# Patient Record
Sex: Male | Born: 1970 | Race: White | Hispanic: No | Marital: Married | Smoking: Never smoker
Health system: Southern US, Community
[De-identification: ages and names within clinical notes are randomized; demographics above are authoritative.]

## PROBLEM LIST (undated history)

## (undated) DIAGNOSIS — J45909 Unspecified asthma, uncomplicated: Secondary | ICD-10-CM

## (undated) HISTORY — DX: Unspecified asthma, uncomplicated: J45.909

## (undated) HISTORY — PX: ANKLE FRACTURE SURGERY: SHX122

---

## 2004-02-18 ENCOUNTER — Emergency Department (HOSPITAL_COMMUNITY): Admission: EM | Admit: 2004-02-18 | Discharge: 2004-02-19 | Payer: Self-pay | Admitting: Family Medicine

## 2004-06-27 ENCOUNTER — Ambulatory Visit (HOSPITAL_COMMUNITY): Admission: RE | Admit: 2004-06-27 | Discharge: 2004-06-27 | Payer: Self-pay | Admitting: Family Medicine

## 2008-08-31 ENCOUNTER — Ambulatory Visit (HOSPITAL_COMMUNITY): Admission: RE | Admit: 2008-08-31 | Discharge: 2008-08-31 | Payer: Self-pay | Admitting: Emergency Medicine

## 2010-08-29 ENCOUNTER — Encounter: Admission: RE | Admit: 2010-08-29 | Discharge: 2010-08-29 | Payer: Self-pay | Admitting: Family Medicine

## 2019-06-17 ENCOUNTER — Telehealth: Payer: Self-pay | Admitting: Family

## 2019-06-17 ENCOUNTER — Ambulatory Visit: Payer: Self-pay

## 2019-06-17 DIAGNOSIS — R0600 Dyspnea, unspecified: Secondary | ICD-10-CM

## 2019-06-17 NOTE — Telephone Encounter (Signed)
Pt called after using Care bot through My Chart. He states that he has some COVID-19 symptoms. SOB, chest feels tight, mild cough and headaches. His symptoms have been ongoing for about 1 week. He has no fever. He is a patient at [facility]. Per protocol patient will contact his PCP for evaluation of symptoms. Care advice read to patient. Patient verbalized understanding of all instructions.  Reason for Disposition . MILD difficulty breathing (e.g., minimal/no SOB at rest, SOB with walking, pulse <100)  Answer Assessment - Initial Assessment Questions 1. COVID-19 DIAGNOSIS: "Who made your Coronavirus (COVID-19) diagnosis?" "Was it confirmed by a positive lab test?" If not diagnosed by a HCP, ask "Are there lots of cases (community spread) where you live?" (See public health department website, if unsure)    [place] 2. ONSET: "When did the COVID-19 symptoms start?"     1 week 3. WORST SYMPTOM: "What is your worst symptom?" (e.g., cough, fever, shortness of breath, muscle aches)    SOB 4. COUGH: "Do you have a cough?" If so, ask: "How bad is the cough?"       mild 5. FEVER: "Do you have a fever?" If so, ask: "What is your temperature, how was it measured, and when did it start?"    no 6. RESPIRATORY STATUS: "Describe your breathing?" (e.g., shortness of breath, wheezing, unable to speak)     SOB but tight 7. BETTER-SAME-WORSE: "Are you getting better, staying the same or getting worse compared to yesterday?"  If getting worse, ask, "In what way?"     same 8. HIGH RISK DISEASE: "Do you have any chronic medical problems?" (e.g., asthma, heart or lung disease, weak immune system, etc.)     Asthma as child 9. PREGNANCY: "Is there any chance you are pregnant?" "When was your last menstrual period?"     N/A 10. OTHER SYMPTOMS: "Do you have any other symptoms?"  (e.g., chills, fatigue, headache, loss of smell or taste, muscle pain, sore throat)      Headache, tired,  Protocols used:  CORONAVIRUS (COVID-19) DIAGNOSED OR SUSPECTED-A-AH

## 2019-06-17 NOTE — Progress Notes (Signed)
E-Visit for Co.e5 rona Virus Screening   Your current symptoms could be consistent with the coronavirus.  Call your health care provider or local health department to request and arrange formal testing. Many health care providers can now test patients at their office but not all are.  Please quarantine yourself while awaiting your test results.  [place] [phone], [place], [place] or visit [email or web address]     COVID-19 is a respiratory illness with symptoms that are similar to the flu. Symptoms are typically mild to moderate, but there have been cases of severe illness and death due to the virus. The following symptoms may appear 2-14 days after exposure: . Fever . Cough . Shortness of breath or difficulty breathing . Chills . Repeated shaking with chills . Muscle pain . Headache . Sore throat . New loss of taste or smell . Fatigue . Congestion or runny nose . Nausea or vomiting . Diarrhea  It is vitally important that if you feel that you have an infection such as this virus or any other virus that you stay home and away from places where you may spread it to others.  You should self-quarantine for 14 days if you have symptoms that could potentially be coronavirus or have been in close contact a with a person diagnosed with COVID-19 within the last 2 weeks. You should avoid contact with people age 22 and older.   You should wear a mask or cloth face covering over your nose and mouth if you must be around other people or animals, including pets (even at home). Try to stay at least 6 feet away from other people. This will protect the people around you.  You may also take acetaminophen (Tylenol) as needed for fever.   Reduce your risk of any infection by using the same precautions used for avoiding the common cold or flu:   Marland Kitchen Wash your hands often with soap and warm water for at least 20 seconds.  If soap and water are not readily available, use an alcohol-based hand sanitizer with at least 60% alcohol.  . If coughing or sneezing, cover your mouth and nose by coughing or sneezing into the elbow areas of your shirt or coat, into a tissue or into your sleeve (not your hands). . Avoid shaking hands with others and consider head nods or verbal greetings only. . Avoid touching your eyes, nose, or mouth with unwashed hands.  . Avoid close contact with people who are sick. . Avoid places or events with large numbers of people in one location, like concerts or sporting events. . Carefully consider travel plans you have or are making. . If you are planning any travel outside or inside the [place], visit the CDC's Travelers' Health webpage for the latest health notices. . If you have some symptoms but not all symptoms, continue to monitor at home and seek medical attention if your symptoms worsen. . If you are having a medical emergency, call 911.  HOME CARE . Only take medications as instructed by your medical team. . Drink plenty of fluids and get plenty of rest. . A steam or ultrasonic humidifier can help if you have congestion.   GET HELP RIGHT AWAY IF YOU HAVE EMERGENCY WARNING SIGNS** FOR COVID-19. If you or someone is showing any of these signs seek emergency medical care immediately. Call 911 or proceed to your closest emergency facility if: . You develop worsening high fever. . Trouble  breathing . Bluish lips or face . Persistent pain or pressure in the chest . New confusion . Inability to wake or stay awake . You cough up blood. . Your symptoms become more severe  **This list is not all possible symptoms. Contact your medical provider for any symptoms that are sever or concerning to you.   MAKE SURE YOU   Understand these instructions.  Will watch your condition.  Will get help right away if you are not  doing well or get worse.  Your e-visit answers were reviewed by a board certified advanced clinical practitioner to complete your personal care plan.  Depending on the condition, your plan could have included both over the counter or prescription medications.  If there is a problem please reply once you have received a response from your provider.  Your safety is important to us.  If you have drug allergies check your prescription carefully.    You can use MyChart to ask questions about today's visit, request a non-urgent call back, or ask for a work or school excuse for 24 hours related to this e-Visit. If it has been greater than 24 hours you will need to follow up with your provider, or enter a new e-Visit to address those concerns. You will get an e-mail in the next two days asking about your experience.  I hope that your e-visit has been valuable and will speed your recovery. Thank you for using e-visits.  Greater than 5 minutes, yet less than 10 minutes of time have been spent researching, coordinating, and implementing care for this patient today.  Thank you for the details you included in the comment boxes. Those details are very helpful in determining the best course of treatment for you and help us to provide the best care.

## 2022-02-28 ENCOUNTER — Emergency Department (HOSPITAL_COMMUNITY)
Admission: EM | Admit: 2022-02-28 | Discharge: 2022-02-28 | Disposition: A | Discharge status: Home / Self Care | Payer: No Typology Code available for payment source | Attending: Emergency Medicine | Admitting: Emergency Medicine

## 2022-02-28 ENCOUNTER — Emergency Department (HOSPITAL_COMMUNITY): Payer: No Typology Code available for payment source

## 2022-02-28 ENCOUNTER — Other Ambulatory Visit: Payer: Self-pay

## 2022-02-28 DIAGNOSIS — W268XXA Contact with other sharp object(s), not elsewhere classified, initial encounter: Secondary | ICD-10-CM | POA: Insufficient documentation

## 2022-02-28 DIAGNOSIS — S61214A Laceration without foreign body of right ring finger without damage to nail, initial encounter: Secondary | ICD-10-CM | POA: Diagnosis not present

## 2022-02-28 DIAGNOSIS — S61212A Laceration without foreign body of right middle finger without damage to nail, initial encounter: Secondary | ICD-10-CM | POA: Insufficient documentation

## 2022-02-28 DIAGNOSIS — S6991XA Unspecified injury of right wrist, hand and finger(s), initial encounter: Secondary | ICD-10-CM | POA: Diagnosis present

## 2022-02-28 MED ORDER — LIDOCAINE HCL (PF) 1 % IJ SOLN
20.0000 mL | Freq: Once | INTRAMUSCULAR | Status: AC
Start: 2022-02-28 — End: 2022-02-28
  Administered 2022-02-28: 20 mL
  Filled 2022-02-28: qty 20

## 2022-02-28 MED ORDER — CEPHALEXIN 500 MG PO CAPS: 500.0000 mg | ORAL_CAPSULE | Freq: Three times a day (TID) | ORAL | 0 refills | Status: AC

## 2022-02-28 MED ORDER — HYDROMORPHONE HCL 1 MG/ML IJ SOLN
1.0000 mg | Freq: Once | INTRAMUSCULAR | Status: AC
  Administered 2022-02-28: 1 mg via INTRAMUSCULAR
  Filled 2022-02-28: qty 1

## 2022-02-28 MED ORDER — OXYCODONE-ACETAMINOPHEN 5-325 MG PO TABS: 1.0000 | ORAL_TABLET | Freq: Four times a day (QID) | ORAL | 0 refills | Status: AC | PRN

## 2022-02-28 NOTE — Discharge Instructions (Signed)
Please follow-up with Dr. Janee Morn, ideally you should be seen early next week for close visit.  Keep wound clean and dry.  Change dressing as discussed.  Take antibiotic as prescribed to help prevent infection.  For pain recommend Tylenol and Motrin as needed.  For breakthrough pain can take the prescribed Percocet.  Do not take Percocet while driving or operating heavy machinery. ? ?If you develop increasing redness, purulence, fever, come back to ER for reassessment. ?

## 2022-02-28 NOTE — ED Triage Notes (Signed)
Pt reports cutting right hand on right table saw, has lacerations to right index, middle and ring finger. Moderate bleeding noted but controlled, bandage applied at triage. Tetanus unknown. Pt is pale, reports feeling lightheaded.  ?

## 2022-02-28 NOTE — ED Provider Triage Note (Signed)
Emergency Medicine Provider Triage Evaluation Note ? ?Joaquim Nam , a 51 y.o. male  was evaluated in triage.  Pt complains of finger lacerations to the right hand after using a table saw about 30 minutes ago.  Right index, right middle, and right ring finger involvement.  Patient had fingers wrapped up in gauze upon arrival.  Hemorrhaging under control.  Denies amputation. ? ?Review of Systems  ?Positive: As above ?Negative: As above ? ?Physical Exam  ?BP 108/89 (BP Location: Left Arm)   Pulse 70   Temp 97.7 ?F (36.5 ?C) (Oral)   Resp 14   SpO2 100%  ?Gen:   Awake, no distress   ?Resp:  Normal effort  ?MSK:   Moves extremities without difficulty  ?Other:  Right index, middle, and ring finger have 2-3 cm lacerations.  Appear moderate to deep.  Blood obstructing deep view.  Demonstrates grossly full ROM.  Slight oozing from laceration sites.  No evidence of necrosis or ischemia to distal digits. ? ?Medical Decision Making  ?Medically screening exam initiated at 3:37 PM.  Appropriate orders placed.  Jhordan Mckibben was informed that the remainder of the evaluation will be completed by another provider, this initial triage assessment does not replace that evaluation, and the importance of remaining in the ED until their evaluation is complete. ? ?Imaging ordered ?  ?Cecil Cobbs, PA-C ?02/28/22 1542 ? ?

## 2022-02-28 NOTE — ED Provider Notes (Signed)
[facility] EMERGENCY DEPARTMENT Provider Note   CSN: [id number] Arrival date & time: 02/28/22  1518     History  Chief Complaint  Patient presents with   Hand Injury    Michael Irwin is a 51 y.o. male.  Presents for hand injury.  Table saw injury, injury happened about 30 minutes prior to arrival.  Multiple fingers involved.  He is left-hand-dominant, injury occurred to his right hand.  Still able to move all of his fingers and still has sensation in all his fingers.  He denies any major medical problems.  He works as a Pharmacist, hospital, Automotive engineer at [employer].  HPI     Home Medications Prior to Admission medications   Medication Sig Start Date End Date Taking? Authorizing Provider  cephALEXin (KEFLEX) 500 MG capsule Take 1 capsule (500 mg total) by mouth 3 (three) times daily for 7 days. 02/28/22 03/07/22 Yes Arihana Ambrocio, Ellwood Dense, MD  oxyCODONE-acetaminophen (PERCOCET/ROXICET) 5-325 MG tablet Take 1 tablet by mouth every 6 (six) hours as needed for up to 3 days for severe pain. 02/28/22 03/03/22 Yes Lucrezia Starch, MD      Allergies    Patient has no known allergies.    Review of Systems   Review of Systems  Musculoskeletal:  Positive for arthralgias.  Skin:  Positive for wound.  All other systems reviewed and are negative.  Physical Exam Updated Vital Signs BP 97/72 (BP Location: Right Arm)    Pulse 61    Temp 98.7 F (37.1 C) (Oral)    Resp 15    SpO2 98%  Physical Exam Vitals and nursing note reviewed.  Constitutional:      General: He is not in acute distress.    Appearance: He is well-developed.  HENT:     Head: Normocephalic and atraumatic.  Eyes:     Conjunctiva/sclera: Conjunctivae normal.  Cardiovascular:     Rate and Rhythm: Normal rate.     Pulses: Normal pulses.  Pulmonary:     Effort: Pulmonary effort is normal. No respiratory distress.  Musculoskeletal:     Cervical back: Neck supple.     Comments: Right hand: There is jagged 1 cm  laceration to the distal volar tip of the ring finger, 2 cm laceration to the distal volar tip of the middle finger, abrasion to the ring finger, flexion/extension in his fingers appears to be grossly intact, no nail involvement, distal cap refill intact in all fingertips  Sensation to light touch is decreased in the radial aspect of the distal ring finger tip and the radial aspect of the distal middle fingertip  Skin:    General: Skin is warm and dry.     Capillary Refill: Capillary refill takes less than 2 seconds.  Neurological:     Mental Status: He is alert.  Psychiatric:        Mood and Affect: Mood normal.        Behavior: Behavior normal.     Media Information Document Information  Photos  Right hand  02/28/2022 21:13  Attached To:  Hospital Encounter on 02/28/22   Source Information  Luccia Reinheimer, Ellwood Dense, MD   [facility]     ED Results / Procedures / Treatments   Labs (all labs ordered are listed, but only abnormal results are displayed) Labs Reviewed - No data to display  EKG None  Radiology DG Hand Complete Left  Result Date: 02/28/2022 CLINICAL DATA:  Table saw laceration. EXAM: LEFT HAND - COMPLETE  3+ VIEW COMPARISON:  None. FINDINGS: Soft tissue irregularity/laceration involving the tips of the index, middle, and ring fingers. No radiopaque foreign body identified. No acute fracture or dislocation. Joint spaces are preserved. Bone mineralization is normal. IMPRESSION: 1. Soft tissue injury of the tips of the index, middle, and ring fingers. No acute osseous abnormality or radiopaque foreign body. Electronically Signed   By: Titus Dubin M.D.   On: 02/28/2022 16:15    Procedures .Marland KitchenLaceration Repair  Date/Time: 03/01/2022 1:09 AM Performed by: Lucrezia Starch, MD Authorized by: Lucrezia Starch, MD   Consent:    Consent obtained:  Verbal   Consent given by:  Patient   Risks, benefits, and alternatives were discussed: yes     Risks discussed:   Infection, need for additional repair, nerve damage, poor wound healing, poor cosmetic result, pain, vascular damage, retained foreign body and tendon damage   Alternatives discussed:  No treatment, delayed treatment, observation and referral Universal protocol:    Immediately prior to procedure, a time out was called: yes   Laceration details:    Location:  Finger   Finger location:  R long finger   Length (cm):  2 Treatment:    Area cleansed with:  Saline   Amount of cleaning:  Extensive   Irrigation solution:  Sterile saline   Irrigation method:  Syringe   Debridement:  None   Undermining:  None Skin repair:    Repair method:  Sutures   Suture size:  4-0   Suture material:  Chromic gut   Suture technique:  Simple interrupted   Number of sutures:  7 Approximation:    Approximation:  Close Repair type:    Repair type:  Intermediate Post-procedure details:    Dressing:  Tube gauze (xeroform gauze)   Procedure completion:  Tolerated well, no immediate complications Comments:     Soaked in sterile water, rinsed with dilute Betadine solution and additional sterile water.  Perform digital block with lidocaine without epinephrine.  Placed finger tourniquet.  Total tourniquet time for finger less than 10 minutes.  Approximated wound using 4-0 Chromic Gut simple interrupted sutures. Marland Kitchen.Laceration Repair  Date/Time: 03/01/2022 1:11 AM Performed by: Lucrezia Starch, MD Authorized by: Lucrezia Starch, MD   Consent:    Consent given by:  Patient   Risks, benefits, and alternatives were discussed: yes     Risks discussed:  Nerve damage, infection, need for additional repair, poor cosmetic result, pain, poor wound healing, vascular damage, tendon damage and retained foreign body   Alternatives discussed:  No treatment, delayed treatment, observation and referral Universal protocol:    Patient identity confirmed:  Verbally with patient Laceration details:    Location:  Finger    Finger location:  R ring finger   Length (cm):  2 Treatment:    Area cleansed with:  Saline and povidone-iodine   Amount of cleaning:  Extensive   Irrigation solution:  Sterile water Skin repair:    Repair method:  Sutures   Suture size:  4-0   Suture material:  Chromic gut   Number of sutures:  4 Approximation:    Approximation:  Close Repair type:    Repair type:  Intermediate Post-procedure details:    Procedure completion:  Tolerated well, no immediate complications Comments:     Soaked in sterile water, rinsed with dilute Betadine solution and additional sterile water.  Perform digital block with lidocaine without epinephrine.  Placed finger tourniquet.  Total tourniquet time for finger  less than 10 minutes.  Approximated wound using 4-0 Chromic Gut simple interrupted sutures.    Medications Ordered in ED Medications  lidocaine (PF) (XYLOCAINE) 1 % injection 20 mL (20 mLs Infiltration Given 02/28/22 2137)  HYDROmorphone (DILAUDID) injection 1 mg (1 mg Intramuscular Given 02/28/22 2135)    ED Course/ Medical Decision Making/ A&P                           Medical Decision Making Risk Prescription drug management.   51 year old gentleman presented to ER with concern for tablesaw injury.  He was found to have laceration to primarily his distal volar right ring finger and right middle finger.  The index finger cut was only very superficial.  X-ray was unremarkable.  Independently reviewed and interpreted results.  He did have decreased sensation over the tips of both ring and middle finger.  Concern for possibility of nerve injury.  Consulted hand surgery and discussed with Dr. Grandville Silos.  He reviewed the case with me and the photos in epic.  Advised closing wounds primarily in the ER today, advised using 4-0 Chromic Gut.  Wounds were thoroughly irrigated with sterile water and then rinsed with dilute Betadine solution followed by additional sterile saline irrigation.  Utilized digital  blocks for both ring and middle finger.  Then performed suture repair.  Finger tourniquet was utilized during repair, less than 10 minutes per finger was used.  While repairing the ring finger and after additional irrigation was concerned about possibility of flexor tendon injury.  I discussed this with Dr. Grandville Silos who states still recommended closure in ER tonight and outpatient follow-up.  He will help make this arrangement.  Patient will be placed on Keflex for antibiotic prophylaxis.  Stressed importance of following up with hand surgery due to concern for possibility of both nerve and tendon injury.  Reviewed return precautions and discharged home with wife.          Final Clinical Impression(s) / ED Diagnoses Final diagnoses:  Laceration of right middle finger without foreign body without damage to nail, initial encounter  Laceration of right ring finger without foreign body without damage to nail, initial encounter    Rx / DC Orders ED Discharge Orders          Ordered    cephALEXin (KEFLEX) 500 MG capsule  3 times daily        02/28/22 2257    oxyCODONE-acetaminophen (PERCOCET/ROXICET) 5-325 MG tablet  Every 6 hours PRN        02/28/22 2257              Lucrezia Starch, MD 03/01/22 0117

## 2022-03-01 NOTE — ED Provider Notes (Signed)
Telephone Encounter ? ?Patient called today to follow-up on his ED visit from yesterday. ?Patient reports that he is feeling well.   ? ?Patient is advised that his tetanus needs updating. ? ?Patient is aware that he can return to the ED for tetanus update.  Patient reports to this provider that he will most likely contact his PCP on Monday and arrange a tetanus update. ? ?Patient otherwise happy with his care as received. ? ? ? ?  ?Wynetta Fines, MD ?03/01/22 1252 ? ?

## 2022-03-26 ENCOUNTER — Encounter: Payer: Self-pay | Admitting: Gastroenterology

## 2022-03-27 ENCOUNTER — Ambulatory Visit (AMBULATORY_SURGERY_CENTER): Payer: BC Managed Care – PPO | Admitting: *Deleted

## 2022-03-27 VITALS — Ht 67.0 in | Wt 170.0 lb

## 2022-03-27 DIAGNOSIS — Z1211 Encounter for screening for malignant neoplasm of colon: Secondary | ICD-10-CM

## 2022-03-27 MED ORDER — CLENPIQ 10-3.5-12 MG-GM -GM/160ML PO SOLN: 1.0000 | ORAL | 0 refills | Status: DC

## 2022-03-27 NOTE — Progress Notes (Signed)

## 2022-04-01 ENCOUNTER — Encounter: Payer: Self-pay | Admitting: Gastroenterology

## 2022-04-11 ENCOUNTER — Encounter: Payer: Self-pay | Admitting: Gastroenterology

## 2022-04-11 ENCOUNTER — Ambulatory Visit (AMBULATORY_SURGERY_CENTER): Payer: BC Managed Care – PPO | Admitting: Gastroenterology

## 2022-04-11 VITALS — BP 106/67 | HR 63 | Temp 98.7°F | Resp 11 | Ht 67.0 in | Wt 170.0 lb

## 2022-04-11 DIAGNOSIS — Z1211 Encounter for screening for malignant neoplasm of colon: Secondary | ICD-10-CM

## 2022-04-11 DIAGNOSIS — K640 First degree hemorrhoids: Secondary | ICD-10-CM

## 2022-04-11 MED ORDER — SODIUM CHLORIDE 0.9 % IV SOLN: 500.0000 mL | Freq: Once | INTRAVENOUS | Status: DC

## 2022-04-11 NOTE — Progress Notes (Signed)
To Pacu, VSS. Report to [email or web address] 

## 2022-04-11 NOTE — Progress Notes (Signed)
Pt's states no medical or surgical changes since previsit or office visit. Vs assessed by C.W 

## 2022-04-11 NOTE — Op Note (Signed)
[facility] ?Patient Name: Michael NamMatthew Brendlinger ?Procedure Date: 04/11/2022 1:26 PM ?MRN: [id number] ?Endoscopist: Doristine LocksVito Rondalyn Belford , MD ?Age: 51 ?Referring MD:  ?Date of Birth: 09/19/1971 ?Gender: Male ?Account #: [id number] ?Procedure:                Colonoscopy ?Indications:              Screening for colorectal malignant neoplasm, This  ?                          is the patient's first colonoscopy ?Medicines:                Monitored Anesthesia Care ?Procedure:                Pre-Anesthesia Assessment: ?                          - Prior to the procedure, a History and Physical  ?                          was performed, and patient medications and  ?                          allergies were reviewed. The patient's tolerance of  ?                          previous anesthesia was also reviewed. The risks  ?                          and benefits of the procedure and the sedation  ?                          options and risks were discussed with the patient.  ?                          All questions were answered, and informed consent  ?                          was obtained. Prior Anticoagulants: The patient has  ?                          taken no previous anticoagulant or antiplatelet  ?                          agents. ASA Grade Assessment: II - A patient with  ?                          mild systemic disease. After reviewing the risks  ?                          and benefits, the patient was deemed in  ?                          satisfactory condition to undergo the procedure. ?  After obtaining informed consent, the colonoscope  ?                          was passed under direct vision. Throughout the  ?                          procedure, the patient's blood pressure, pulse, and  ?                          oxygen saturations were monitored continuously. The  ?                          Olympus CF-HQ190L ([id number]) Colonoscope was  ?                          introduced through the anus  and advanced to the the  ?                          terminal ileum. The colonoscopy was performed  ?                          without difficulty. The patient tolerated the  ?                          procedure well. The quality of the bowel  ?                          preparation was excellent. The terminal ileum,  ?                          ileocecal valve, appendiceal orifice, and rectum  ?                          were photographed. ?Scope In: 1:31:00 PM ?Scope Out: 1:46:28 PM ?Scope Withdrawal Time: 0 hours 10 minutes 45 seconds  ?Total Procedure Duration: 0 hours 15 minutes 28 seconds  ?Findings:                 The perianal and digital rectal examinations were  ?                          normal. ?                          The entire colon appeared normal. ?                          Non-bleeding internal hemorrhoids were found during  ?                          retroflexion. The hemorrhoids were small and Grade  ?                          I (internal hemorrhoids that do not prolapse). ?  The terminal ileum appeared normal. ?Complications:            No immediate complications. ?Estimated Blood Loss:     Estimated blood loss: none. ?Impression:               - The entire colon is normal. ?                          - Non-bleeding internal hemorrhoids. ?                          - The examined portion of the ileum was normal. ?                          - No specimens collected. ?Recommendation:           - Patient has a contact number available for  ?                          emergencies. The signs and symptoms of potential  ?                          delayed complications were discussed with the  ?                          patient. Return to normal activities tomorrow.  ?                          Written discharge instructions were provided to the  ?                          patient. ?                          - Resume previous diet. ?                          - Continue present  medications. ?                          - Repeat colonoscopy in 10 years for screening  ?                          purposes. ?                          - Return to GI office PRN. ?Doristine Locks, MD ?04/11/2022 1:49:50 PM ?

## 2022-04-11 NOTE — Patient Instructions (Signed)
Handout given on hemorrhoids.  YOU HAD AN ENDOSCOPIC PROCEDURE TODAY AT THE [facility]:   Refer to the procedure report that was given to you for any specific questions about what was found during the examination.  If the procedure report does not answer your questions, please call your gastroenterologist to clarify.  If you requested that your care partner not be given the details of your procedure findings, then the procedure report has been included in a sealed envelope for you to review at your convenience later.  YOU SHOULD EXPECT: Some feelings of bloating in the abdomen. Passage of more gas than usual.  Walking can help get rid of the air that was put into your GI tract during the procedure and reduce the bloating. If you had a lower endoscopy (such as a colonoscopy or flexible sigmoidoscopy) you may notice spotting of blood in your stool or on the toilet paper. If you underwent a bowel prep for your procedure, you may not have a normal bowel movement for a few days.  Please Note:  You might notice some irritation and congestion in your nose or some drainage.  This is from the oxygen used during your procedure.  There is no need for concern and it should clear up in a day or so.  SYMPTOMS TO REPORT IMMEDIATELY:  Following lower endoscopy (colonoscopy or flexible sigmoidoscopy):  Excessive amounts of blood in the stool  Significant tenderness or worsening of abdominal pains  Swelling of the abdomen that is new, acute  Fever of 100F or higher   For urgent or emergent issues, a gastroenterologist can be reached at any hour by calling [phone]. Do not use MyChart messaging for urgent concerns.    DIET:  We do recommend a small meal at first, but then you may proceed to your regular diet.  Drink plenty of fluids but you should avoid alcoholic beverages for 24 hours.  ACTIVITY:  You should plan to take it easy for the rest of today and you should NOT DRIVE or use heavy  machinery until tomorrow (because of the sedation medicines used during the test).    FOLLOW UP: Our staff will call the number listed on your records 48-72 hours following your procedure to check on you and address any questions or concerns that you may have regarding the information given to you following your procedure. If we do not reach you, we will leave a message.  We will attempt to reach you two times.  During this call, we will ask if you have developed any symptoms of COVID 19. If you develop any symptoms (ie: fever, flu-like symptoms, shortness of breath, cough etc.) before then, please call [phone].  If you test positive for Covid 19 in the 2 weeks post procedure, please call and report this information to us.    If any biopsies were taken you will be contacted by phone or by letter within the next 1-3 weeks.  Please call us at [phone] if you have not heard about the biopsies in 3 weeks.    SIGNATURES/CONFIDENTIALITY: You and/or your care partner have signed paperwork which will be entered into your electronic medical record.  These signatures attest to the fact that that the information above on your After Visit Summary has been reviewed and is understood.  Full responsibility of the confidentiality of this discharge information lies with you and/or your care-partner.  

## 2022-04-11 NOTE — Progress Notes (Signed)
? ?GASTROENTEROLOGY PROCEDURE H&P NOTE  ? ?Primary Care Physician: ?[facility] [place] ? ? ? ?Reason for Procedure:  Colon Cancer screening ? ?Plan:    Colonoscopy ? ?Patient is appropriate for endoscopic procedure(s) in the ambulatory ([facility]) setting. ? ?The nature of the procedure, as well as the risks, benefits, and alternatives were carefully and thoroughly reviewed with the patient. Ample time for discussion and questions allowed. The patient understood, was satisfied, and agreed to proceed.  ? ? ? ?HPI: ?Michael Irwin is a 51 y.o. male who presents for colonoscopy for routine Colon Cancer screening.  No active GI symptoms.   ? ?Past Medical History:  ?Diagnosis Date  ? Asthma   ? ? ?Past Surgical History:  ?Procedure Laterality Date  ? ANKLE FRACTURE SURGERY    ? ? ?Prior to Admission medications   ?Medication Sig Start Date End Date Taking? Authorizing Provider  ?escitalopram (LEXAPRO) 10 MG tablet  03/03/22  Yes [provider]  ?fenofibrate 54 MG tablet  03/03/22  Yes [provider]  ?losartan (COZAAR) 25 MG tablet  03/03/22  Yes [provider]  ?melatonin 5 MG TABS Take 5 mg by mouth.   Yes [provider]  ?naproxen sodium (ALEVE) 220 MG tablet 1-2 tablet with food or milk as needed 12/30/18  Yes [provider]  ?albuterol (VENTOLIN HFA) 108 (90 Base) MCG/ACT inhaler 2 puffs as needed    [provider]  ?fluticasone (FLONASE) 50 MCG/ACT nasal spray 2 sprays    [provider]  ?HYDROcodone-acetaminophen (NORCO) 10-325 MG tablet Take 1-2 tablets by mouth daily. 03/10/22   [provider]  ? ? ?Current Outpatient Medications  ?Medication Sig Dispense Refill  ? escitalopram (LEXAPRO) 10 MG tablet     ? fenofibrate 54 MG tablet     ? losartan (COZAAR) 25 MG tablet     ? melatonin 5 MG TABS Take 5 mg by mouth.    ? naproxen sodium (ALEVE) 220 MG tablet 1-2 tablet with food or milk as needed    ? albuterol (VENTOLIN HFA)  108 (90 Base) MCG/ACT inhaler 2 puffs as needed    ? fluticasone (FLONASE) 50 MCG/ACT nasal spray 2 sprays    ? HYDROcodone-acetaminophen (NORCO) 10-325 MG tablet Take 1-2 tablets by mouth daily.    ? ?Current Facility-Administered Medications  ?Medication Dose Route Frequency Provider Last Rate Last Admin  ? 0.9 %  sodium chloride infusion  500 mL Intravenous Once Matej Sappenfield V, DO      ? ? ?Allergies as of 04/11/2022  ? (No Known Allergies)  ? ? ?Family History  ?Problem Relation Age of Onset  ? Colon cancer Neg Hx   ? Colon polyps Neg Hx   ? Esophageal cancer Neg Hx   ? Stomach cancer Neg Hx   ? Rectal cancer Neg Hx   ? ? ?Social History  ? ?Socioeconomic History  ? Marital status: Married  ?  Spouse name: Not on file  ? Number of children: Not on file  ? Years of education: Not on file  ? Highest education level: Not on file  ?Occupational History  ? Not on file  ?Tobacco Use  ? Smoking status: Never  ? Smokeless tobacco: Never  ?Vaping Use  ? Vaping Use: Never used  ?Substance and Sexual Activity  ? Alcohol use: Never  ? Drug use: Yes  ?  Frequency: 2.0 times per week  ?  Types: Marijuana  ? Sexual activity:  Not on file  ?Other Topics Concern  ? Not on file  ?Social History Narrative  ? Not on file  ? ?Social Determinants of Health  ? ?Financial Resource Strain: Not on file  ?Food Insecurity: Not on file  ?Transportation Needs: Not on file  ?Physical Activity: Not on file  ?Stress: Not on file  ?Social Connections: Not on file  ?Intimate Partner Violence: Not on file  ? ? ?Physical Exam: ?Vital signs in last 24 hours: ?@BP  131/75   Pulse 70   Temp 98.7 ?F (37.1 ?C) (Skin)   Ht 5\' 7"  (1.702 m)   Wt 170 lb (77.1 kg)   SpO2 100%   BMI 26.63 kg/m?  ?GEN: NAD ?EYE: Sclerae anicteric ?ENT: MMM ?CV: Non-tachycardic ?Pulm: CTA b/l ?GI: Soft, NT/ND ?NEURO:  Alert & Oriented x 3 ? ? ? , DO ?[facility] ? ? ?04/11/2022 1:11 PM ? ?

## 2022-04-15 ENCOUNTER — Telehealth: Payer: Self-pay

## 2022-04-15 ENCOUNTER — Telehealth: Payer: Self-pay | Admitting: *Deleted

## 2022-04-15 NOTE — Telephone Encounter (Signed)
Note entered in error.  First attempt, left VM.  ?

## 2022-04-15 NOTE — Telephone Encounter (Signed)
?  Follow up Call- ? ? ?  04/11/2022  ? 12:46 PM  ?Call back number  ?Post procedure Call Back phone  # [phone]  ?Permission to leave phone message Yes  ?  ? ?Patient questions: ? ?Do you have a fever, pain , or abdominal swelling? No. ?Pain Score  0 * ? ?Have you tolerated food without any problems? Yes.   ? ?Have you been able to return to your normal activities? Yes.   ? ?Do you have any questions about your discharge instructions: ?Diet   No. ?Medications  No. ?Follow up visit  No. ? ?Do you have questions or concerns about your Care? No. ? ?Actions: ?* If pain score is 4 or above: ?No action needed, pain <4. ? ? ?

## 2022-04-15 NOTE — Telephone Encounter (Signed)
Attempted 2nd follow up call. No answer ?

## 2022-11-17 IMAGING — CR DG HAND COMPLETE 3+V*L*
3 series · 3 of 3 positions shown · non-contrast
Comparison: None.

CLINICAL DATA: Table saw laceration.

EXAM:
LEFT HAND - COMPLETE 3+ VIEW

[hand pa]
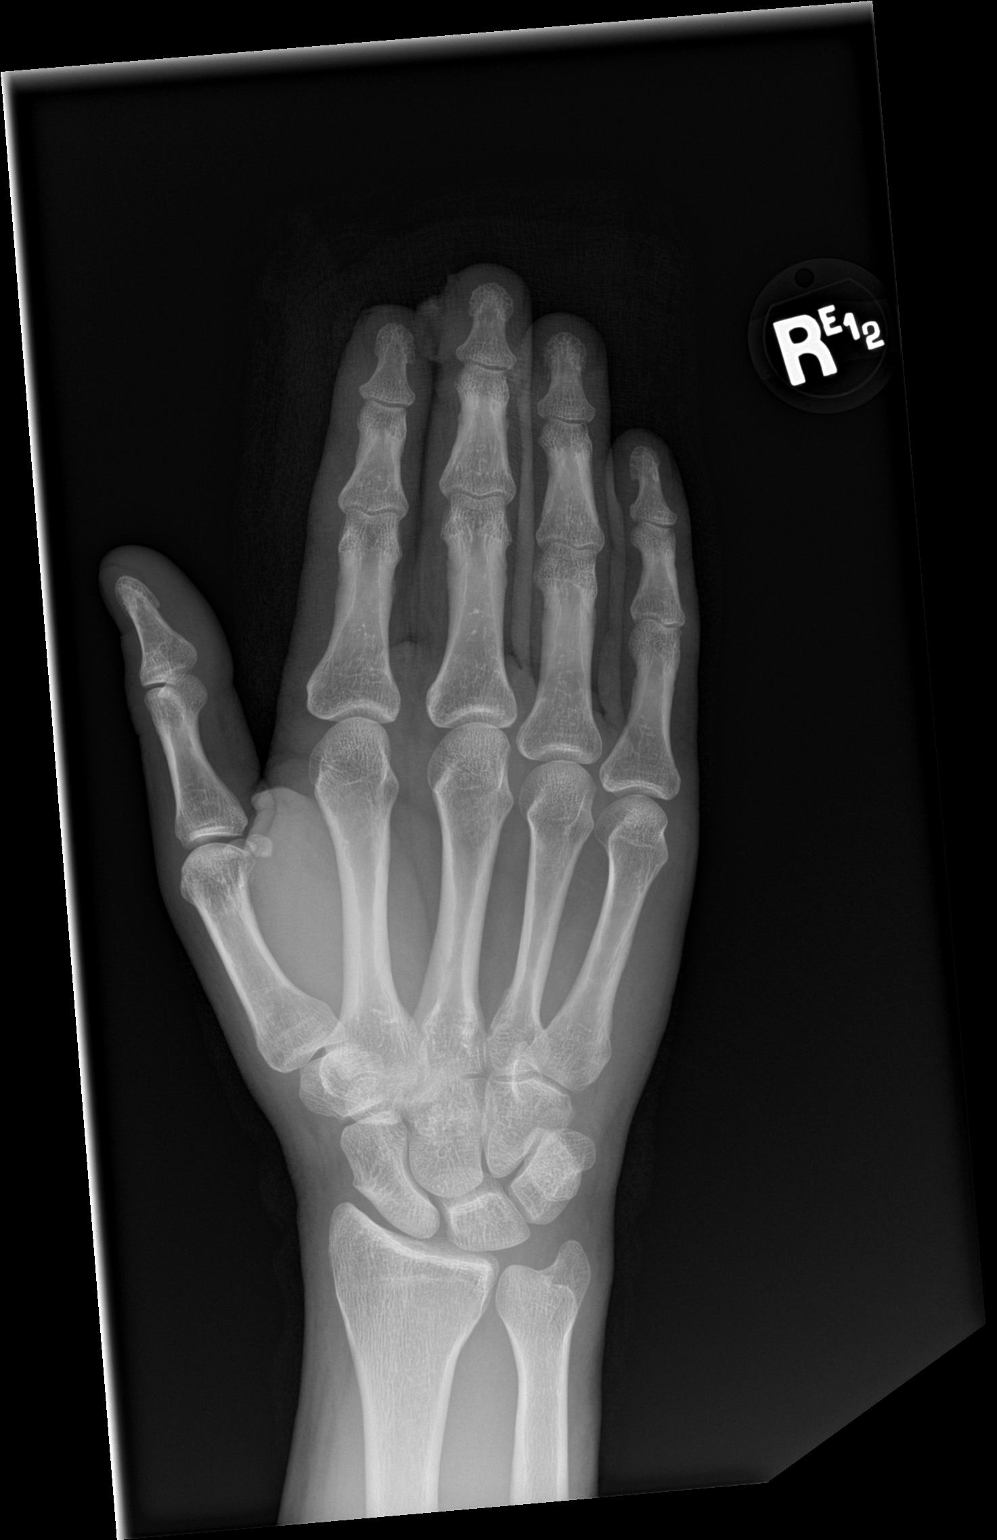

[hand obl]
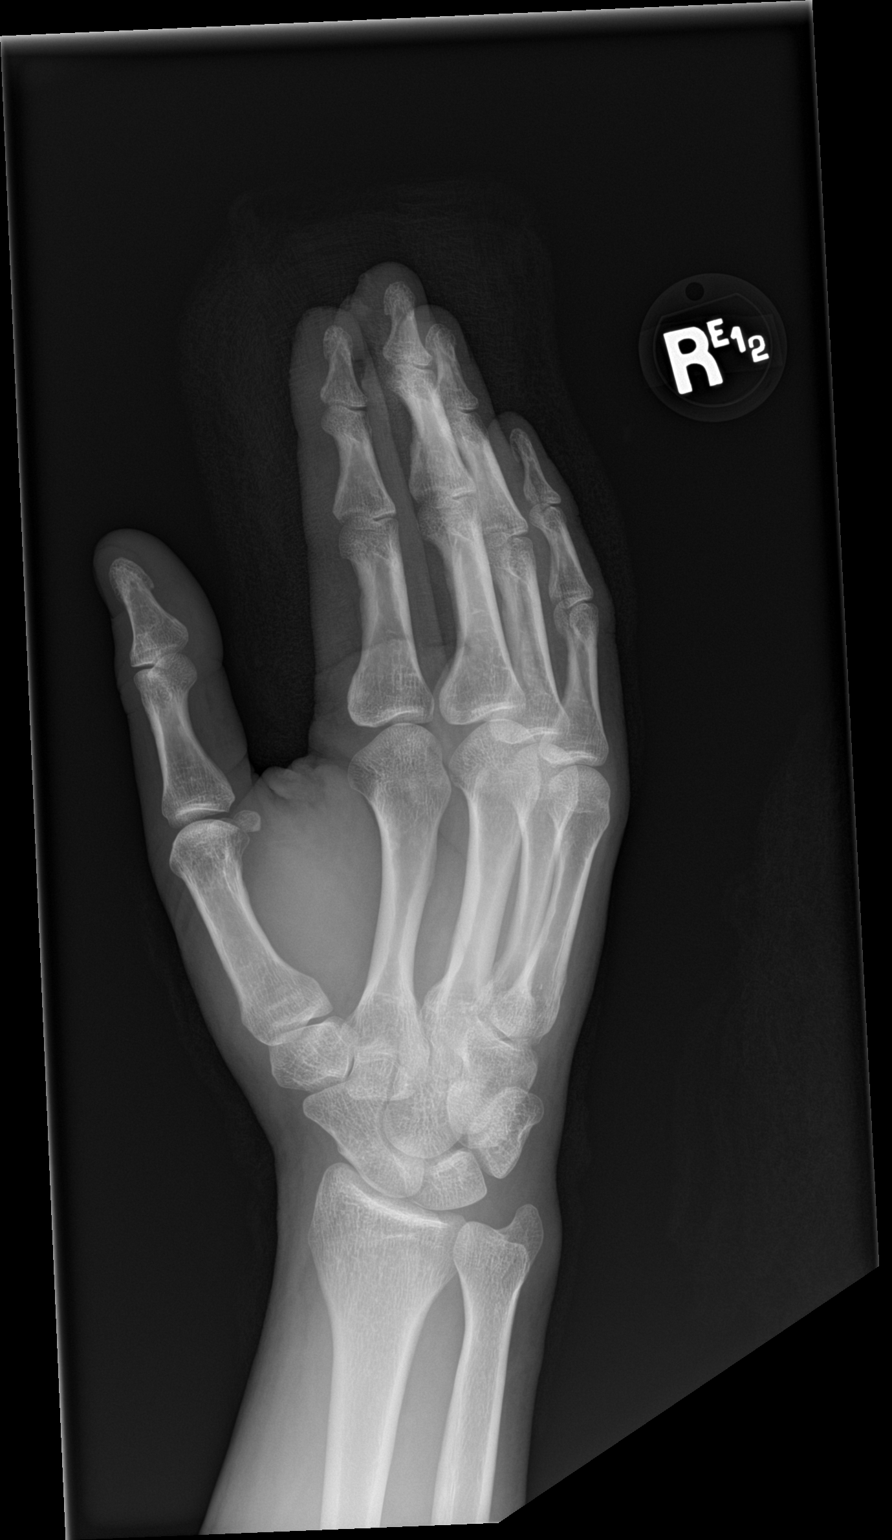

[hand lat]
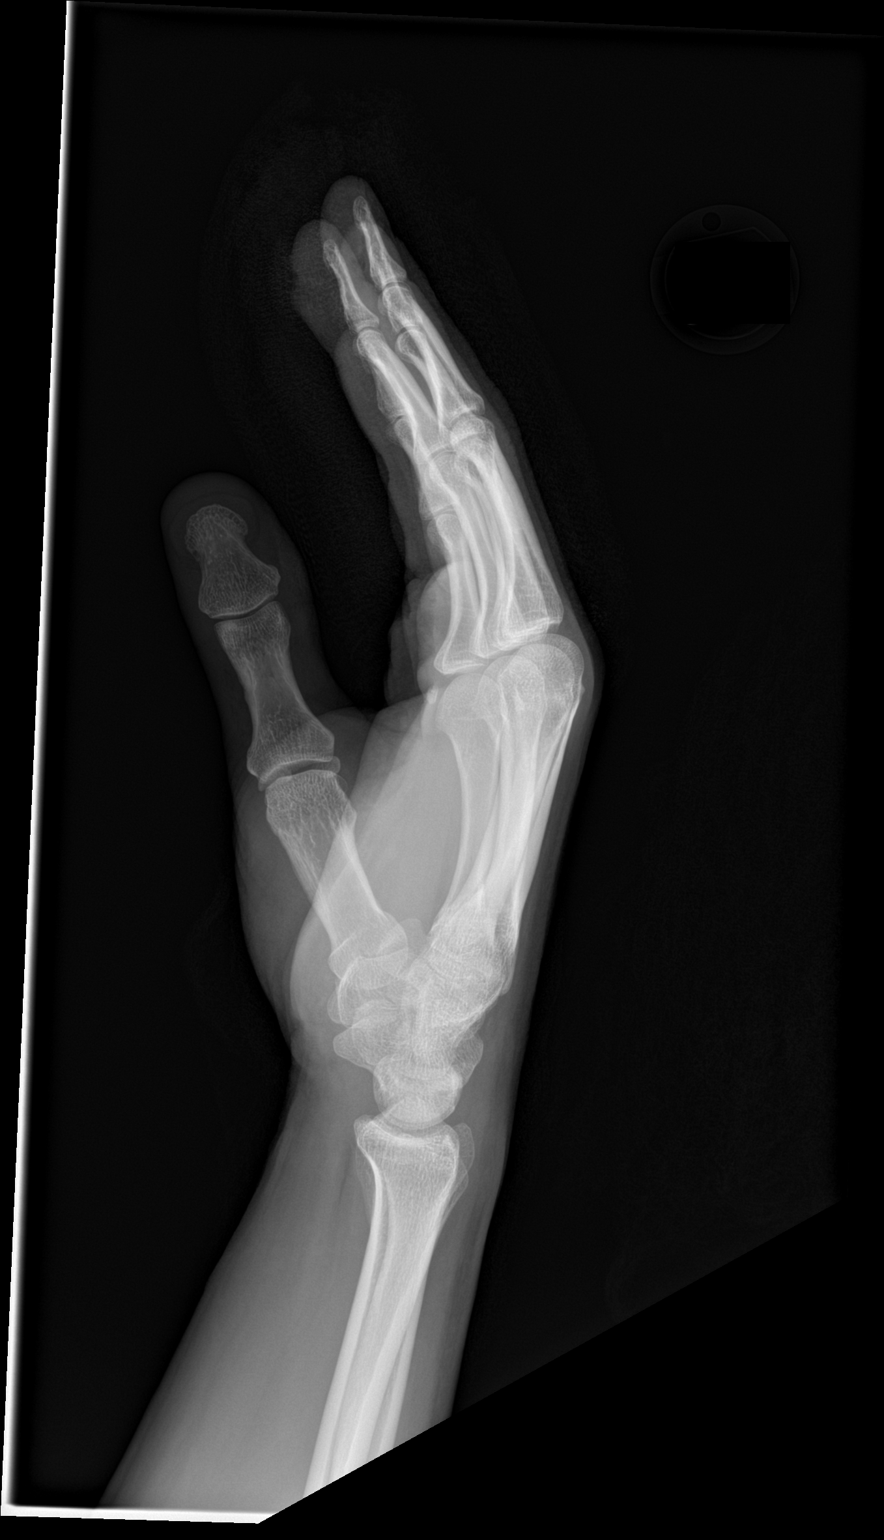

[3 of 3 positions shown; findings below may reference images not displayed]

FINDINGS: Soft tissue irregularity/laceration involving the tips of the index,
middle, and ring fingers. No radiopaque foreign body identified. No
acute fracture or dislocation. Joint spaces are preserved. Bone
mineralization is normal.
IMPRESSION: 1. Soft tissue injury of the tips of the index, middle, and ring
fingers. No acute osseous abnormality or radiopaque foreign body.
